# Patient Record
Sex: Male | Born: 1985 | Race: White | Hispanic: No | Marital: Married | Smoking: Current every day smoker
Health system: Southern US, Community
[De-identification: ages and names within clinical notes are randomized; demographics above are authoritative.]

## PROBLEM LIST (undated history)

## (undated) DIAGNOSIS — N19 Unspecified kidney failure: Secondary | ICD-10-CM

## (undated) DIAGNOSIS — K219 Gastro-esophageal reflux disease without esophagitis: Secondary | ICD-10-CM

## (undated) HISTORY — PX: DENTAL SURGERY: SHX609

---

## 2006-08-04 ENCOUNTER — Emergency Department (HOSPITAL_COMMUNITY): Admission: EM | Admit: 2006-08-04 | Discharge: 2006-08-04 | Payer: Self-pay | Admitting: Emergency Medicine

## 2006-08-21 ENCOUNTER — Emergency Department (HOSPITAL_COMMUNITY): Admission: EM | Admit: 2006-08-21 | Discharge: 2006-08-21 | Payer: Self-pay | Admitting: Emergency Medicine

## 2007-08-05 ENCOUNTER — Emergency Department (HOSPITAL_COMMUNITY): Admission: EM | Admit: 2007-08-05 | Discharge: 2007-08-05 | Payer: Self-pay | Admitting: Emergency Medicine

## 2012-05-04 ENCOUNTER — Encounter (HOSPITAL_COMMUNITY): Payer: Self-pay | Admitting: Physical Medicine and Rehabilitation

## 2012-05-04 ENCOUNTER — Emergency Department (HOSPITAL_COMMUNITY)
Admission: EM | Admit: 2012-05-04 | Discharge: 2012-05-04 | Disposition: A | Discharge status: Home / Self Care | Payer: Self-pay | Attending: Emergency Medicine | Admitting: Emergency Medicine

## 2012-05-04 DIAGNOSIS — F17200 Nicotine dependence, unspecified, uncomplicated: Secondary | ICD-10-CM | POA: Insufficient documentation

## 2012-05-04 DIAGNOSIS — M545 Low back pain, unspecified: Secondary | ICD-10-CM | POA: Insufficient documentation

## 2012-05-04 HISTORY — DX: Unspecified kidney failure: N19

## 2012-05-04 LAB — CBC WITH DIFFERENTIAL/PLATELET
Basophils Relative: 0 % (ref 0–1)
Eosinophils Absolute: 0.2 10*3/uL (ref 0.0–0.7)
Eosinophils Relative: 2 % (ref 0–5)
Hemoglobin: 16.8 g/dL (ref 13.0–17.0)
MCH: 34.4 pg — ABNORMAL HIGH (ref 26.0–34.0)
Monocytes Absolute: 0.6 10*3/uL (ref 0.1–1.0)
Neutro Abs: 5.3 10*3/uL (ref 1.7–7.7)
Neutrophils Relative %: 56 % (ref 43–77)
Platelets: 329 10*3/uL (ref 150–400)
RBC: 4.89 MIL/uL (ref 4.22–5.81)

## 2012-05-04 LAB — COMPREHENSIVE METABOLIC PANEL
ALT: 13 U/L (ref 0–53)
CO2: 27 mEq/L (ref 19–32)
Calcium: 9.8 mg/dL (ref 8.4–10.5)
Chloride: 101 mEq/L (ref 96–112)
Creatinine, Ser: 1.22 mg/dL (ref 0.50–1.35)
GFR calc Af Amer: >90 mL/min (ref >90)
GFR calc non Af Amer: 80 mL/min — ABNORMAL LOW (ref >90)
Glucose, Bld: 103 mg/dL — ABNORMAL HIGH (ref 70–99)
Sodium: 139 mEq/L (ref 135–145)
Total Bilirubin: 0.5 mg/dL (ref 0.3–1.2)

## 2012-05-04 LAB — URINALYSIS, ROUTINE W REFLEX MICROSCOPIC
Bilirubin Urine: NEGATIVE
Glucose, UA: NEGATIVE mg/dL
Hgb urine dipstick: NEGATIVE
Protein, ur: NEGATIVE mg/dL
Urobilinogen, UA: 0.2 mg/dL (ref 0.0–1.0)

## 2012-05-04 MED ORDER — OXYCODONE-ACETAMINOPHEN 5-325 MG PO TABS
1.0000 | ORAL_TABLET | Freq: Once | ORAL | Status: AC
  Administered 2012-05-04: 1 via ORAL
  Filled 2012-05-04: qty 1

## 2012-05-04 MED ORDER — OXYCODONE-ACETAMINOPHEN 5-325 MG PO TABS: 2.0000 | ORAL_TABLET | Freq: Four times a day (QID) | ORAL | Status: DC | PRN

## 2012-05-04 MED ORDER — CYCLOBENZAPRINE HCL 10 MG PO TABS: 10.0000 mg | ORAL_TABLET | Freq: Three times a day (TID) | ORAL | Status: DC | PRN

## 2012-05-04 NOTE — ED Notes (Signed)
Pt presents to department for evaluation of lower back pain. Onset last night. Pt states he was diagnosed with kidney failure in June, concerned that labs are elevated again. Denies urinary symptoms. 10/10 pain. He is conscious alert and oriented x4.

## 2012-05-04 NOTE — ED Provider Notes (Signed)
History   This chart was scribed for Hurman Horn, MD by Melba Coon. The patient was seen in room TR10C/TR10C and the patient's care was started at 6:50PM.    CSN: [id number]  Arrival date & time 05/04/12  1316   First MD Initiated Contact with Patient 05/04/12 1848      Chief Complaint  Patient presents with  . Back Pain    (Consider location/radiation/quality/duration/timing/severity/associated sxs/prior treatment) HPI Eddie Allen is a 26 y.o. male who presents to the Emergency Department complaining of constant, moderate to severe lower back pain with a sudden onset last night. Eddie Allen states that he coughed while driving then the pain started. He is concerned that he has abnormal kidney labs. He rates the severity of the ain 10/10. No radiation down to the lower extremities. Moving and changing positions aggravates the pain.  No HA, fever, neck pain, sore throat, rash, CP, SOB, abd pain, n/v/d, dysuria, bowl or bladder dysfunction, or extremity pain, edema, weakness, numbness, or tingling. Hx of renal failure. No Hx of chronic back pain. No known allergies. No other pertinent medical symptoms.  Past Medical History  Diagnosis Date  . Renal failure     No past surgical history on file.  History reviewed. No pertinent family history.  History  Substance Use Topics  . Smoking status: Current Every Day Smoker    Types: Cigarettes  . Smokeless tobacco: Not on file  . Alcohol Use: No      Review of Systems 10 Systems reviewed and all are negative for acute change except as noted in the HPI.   Allergies  Review of patient's allergies indicates no known allergies.  Home Medications   Current Outpatient Rx  Name Route Sig Dispense Refill  . AZO TABS PO Oral Take 2 capsules by mouth once. As needed uti pain.    . CYCLOBENZAPRINE HCL 10 MG PO TABS Oral Take 1 tablet (10 mg total) by mouth 3 (three) times daily as needed for muscle spasms. 20 tablet 0  .  OXYCODONE-ACETAMINOPHEN 5-325 MG PO TABS Oral Take 2 tablets by mouth every 6 (six) hours as needed for pain. 20 tablet 0    BP 129/74  Pulse 76  Temp 98.6 F (37 C) (Oral)  Resp 16  SpO2 97%  Physical Exam  Nursing note and vitals reviewed. Constitutional:       Awake, alert, nontoxic appearance with baseline speech.  HENT:  Head: Atraumatic.  Eyes: Pupils are equal, round, and reactive to light. Right eye exhibits no discharge. Left eye exhibits no discharge.  Neck: Neck supple.  Cardiovascular: Normal rate and regular rhythm.   No murmur heard. Pulmonary/Chest: Effort normal and breath sounds normal. No respiratory distress. He has no wheezes. He has no rales. He exhibits no tenderness.  Abdominal: Soft. Bowel sounds are normal. He exhibits no mass. There is no tenderness. There is no rebound.  Musculoskeletal: He exhibits tenderness (Diffuse lumbar and paralumbar tenderness with no rash).       Thoracic back: He exhibits no tenderness.       Lumbar back: He exhibits no tenderness.       Bilateral lower extremities non tender without new rashes or color change, baseline ROM with intact DP pulses, CR<2 secs all digits bilaterally, sensation baseline light touch bilaterally for pt, DTR's symmetric and intact bilaterally KJ / AJ, motor symmetric bilateral 5 / 5 hip flexion, quadriceps, hamstrings, EHL, foot dorsiflexion, foot plantarflexion, gait somewhat antalgic but without apparent  new ataxia.  Neurological:       Mental status baseline for patient.  Upper extremity motor strength and sensation intact and symmetric bilaterally.  Skin: No rash noted.  Psychiatric: He has a normal mood and affect.    ED Course  Procedures (including critical care time)  COORDINATION OF CARE:  6:55PM - Labs reviewed and are unremarkable; no kidney failure based on labs. Percocet and flexeril per pt's request will be given to Eddie Allen here at the ED, and he is ready for d/c. He is advised to  f/u with PCP or ED and get an MRI.   Labs Reviewed  URINALYSIS, ROUTINE W REFLEX MICROSCOPIC - Abnormal; Notable for the following:    Color, Urine AMBER (*)  BIOCHEMICALS MAY BE AFFECTED BY COLOR   All other components within normal limits  CBC WITH DIFFERENTIAL - Abnormal; Notable for the following:    MCH 34.4 (*)     MCHC 37.0 (*)     All other components within normal limits  COMPREHENSIVE METABOLIC PANEL - Abnormal; Notable for the following:    Glucose, Bld 103 (*)     GFR calc non Af Amer 80 (*)     All other components within normal limits  LAB REPORT - SCANNED   No results found.   1. Low back pain       MDM   Pt stable in ED with no significant deterioration in condition.Patient / Family / Caregiver informed of clinical course, understand medical decision-making process, and agree with plan.I doubt any other EMC precluding discharge at this time including, but not necessarily limited to the following:[facility]. I personally performed the services described in this documentation, which was scribed in my presence. The recorded information has been reviewed and considered.      Hurman Horn, MD 05/05/12 2025

## 2016-12-28 ENCOUNTER — Other Ambulatory Visit: Payer: Self-pay | Admitting: Orthopedic Surgery

## 2016-12-28 DIAGNOSIS — M25572 Pain in left ankle and joints of left foot: Secondary | ICD-10-CM

## 2017-01-05 ENCOUNTER — Ambulatory Visit
Admission: RE | Admit: 2017-01-05 | Discharge: 2017-01-05 | Disposition: A | Discharge status: Home / Self Care | Payer: Medicaid Other | Source: Ambulatory Visit | Attending: Orthopedic Surgery | Admitting: Orthopedic Surgery

## 2017-01-05 DIAGNOSIS — M25572 Pain in left ankle and joints of left foot: Secondary | ICD-10-CM

## 2017-05-03 ENCOUNTER — Other Ambulatory Visit: Payer: Self-pay | Admitting: Orthopedic Surgery

## 2017-05-03 DIAGNOSIS — M5030 Other cervical disc degeneration, unspecified cervical region: Secondary | ICD-10-CM

## 2017-05-03 DIAGNOSIS — M5410 Radiculopathy, site unspecified: Secondary | ICD-10-CM

## 2017-05-17 ENCOUNTER — Ambulatory Visit
Admission: RE | Admit: 2017-05-17 | Discharge: 2017-05-17 | Disposition: A | Discharge status: Home / Self Care | Payer: Medicaid Other | Source: Ambulatory Visit | Attending: Orthopedic Surgery | Admitting: Orthopedic Surgery

## 2017-05-17 DIAGNOSIS — M5030 Other cervical disc degeneration, unspecified cervical region: Secondary | ICD-10-CM

## 2017-05-17 DIAGNOSIS — M5410 Radiculopathy, site unspecified: Secondary | ICD-10-CM

## 2018-04-20 IMAGING — MR MR CERVICAL SPINE W/O CM
5 series · 28 of 48 positions shown · non-contrast
Comparison: Lateral cervical spine radiographs 10/22/2014. Cervical
spine CT 03/21/2013.

CLINICAL DATA: Worsening neck pain for 2 years. Weakness and
numbness in the hands.

EXAM:
MRI CERVICAL SPINE WITHOUT CONTRAST
TECHNIQUE: Multiplanar, multisequence MR imaging of the cervical spine was
performed. No intravenous contrast was administered.

[Series 6: T1 · sagittal · 3.0mm · 0.72mm/px · 6 of 15 slices shown]
[im 1/15]
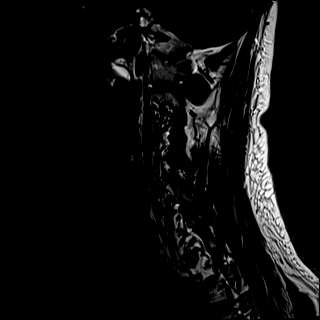
[im 3/15]
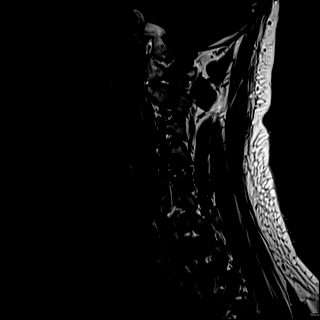
[im 6/15]
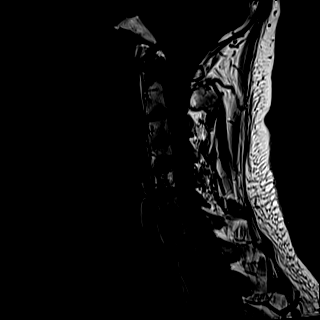
[im 9/15]
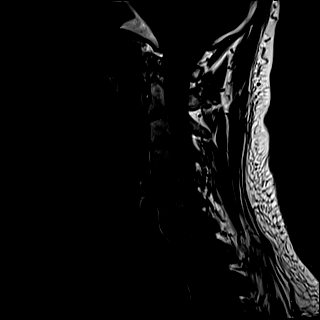
[im 12/15]
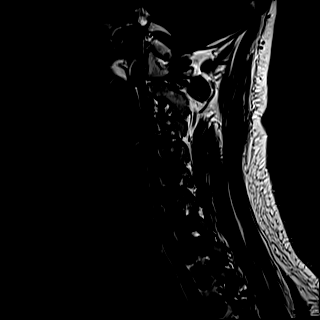
[im 15/15]
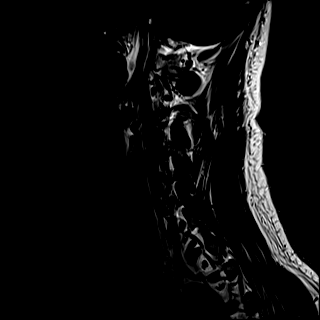

[Series 7: T2 · sagittal · 3.0mm · 0.60mm/px · 6 of 15 slices shown (1 of 2)]
[im 1/15]
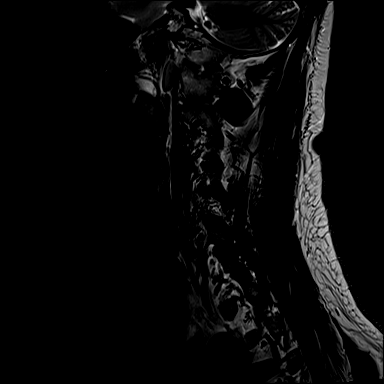
[im 3/15]
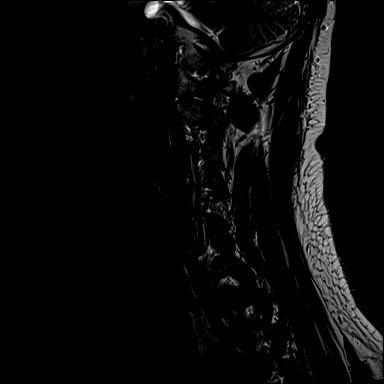
[im 6/15]
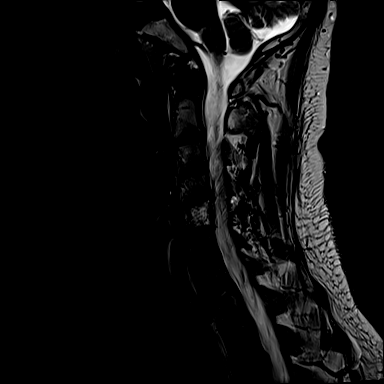
[im 9/15]
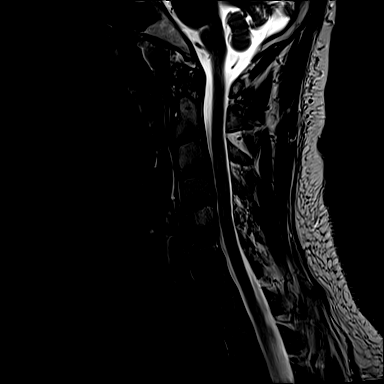
[im 12/15]
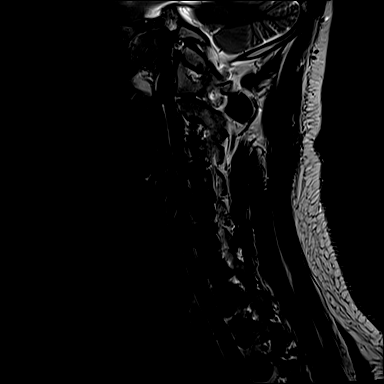
[im 15/15]
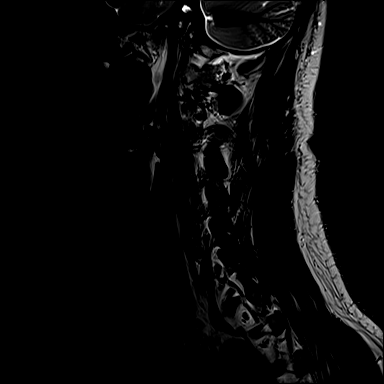

[Series 8: STIR · sagittal · 3.0mm · 0.36mm/px · 6 of 15 slices shown]
[im 1/15]
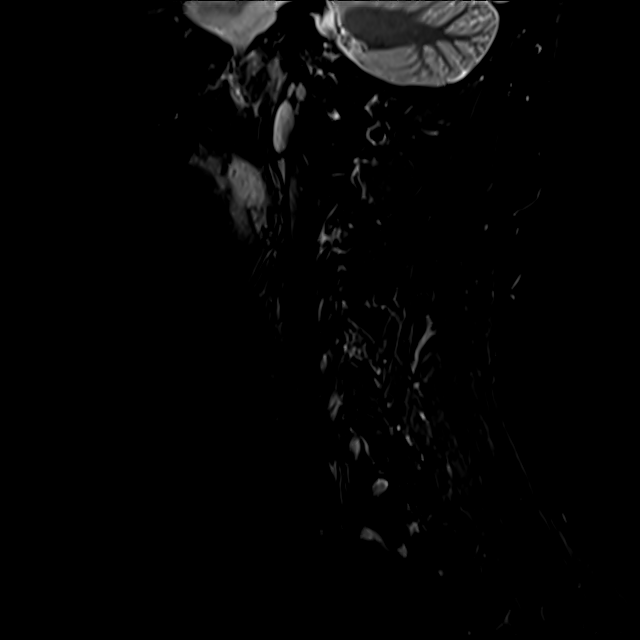
[im 3/15]
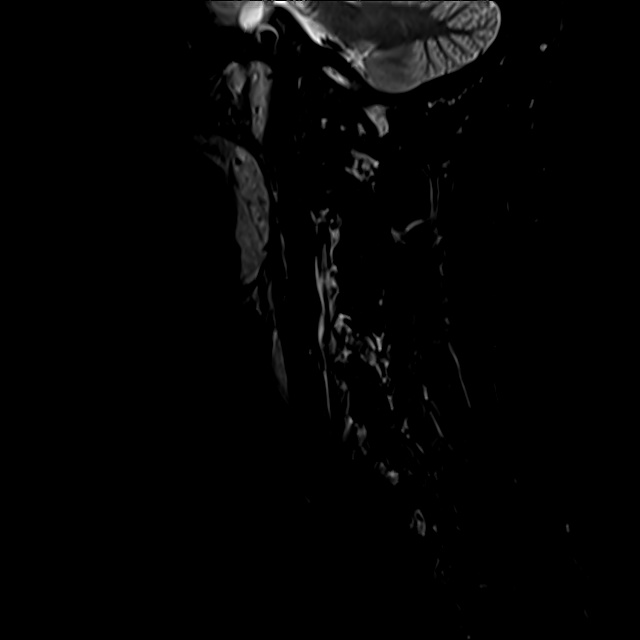
[im 6/15]
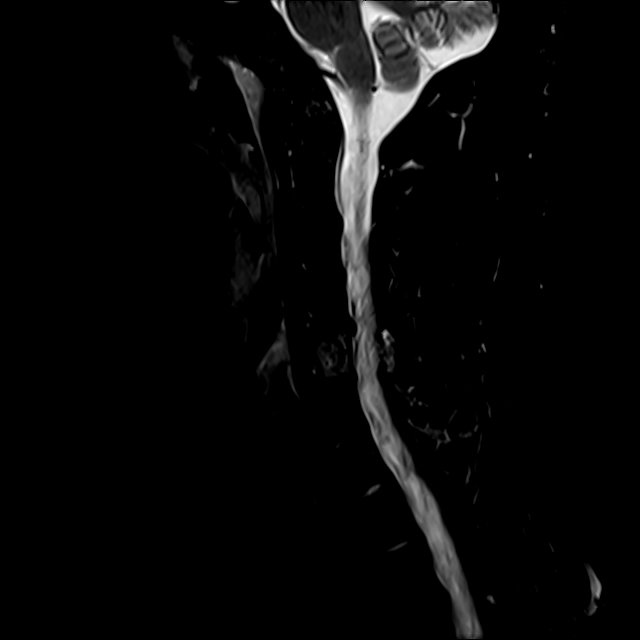
[im 9/15]
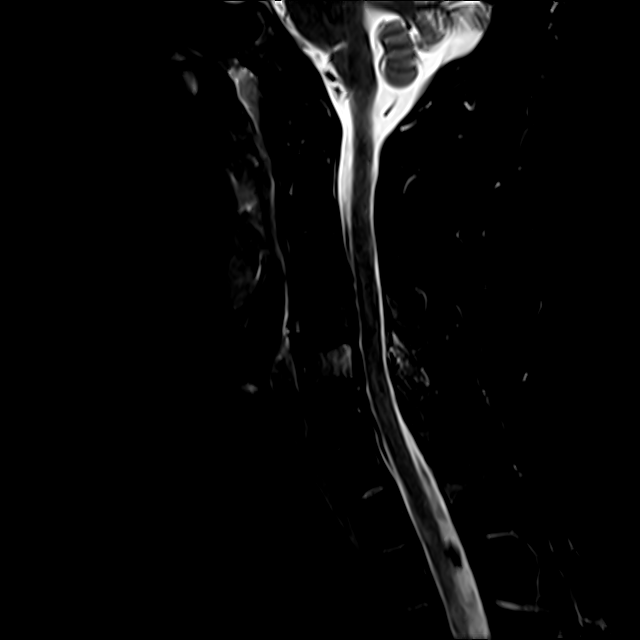
[im 12/15]
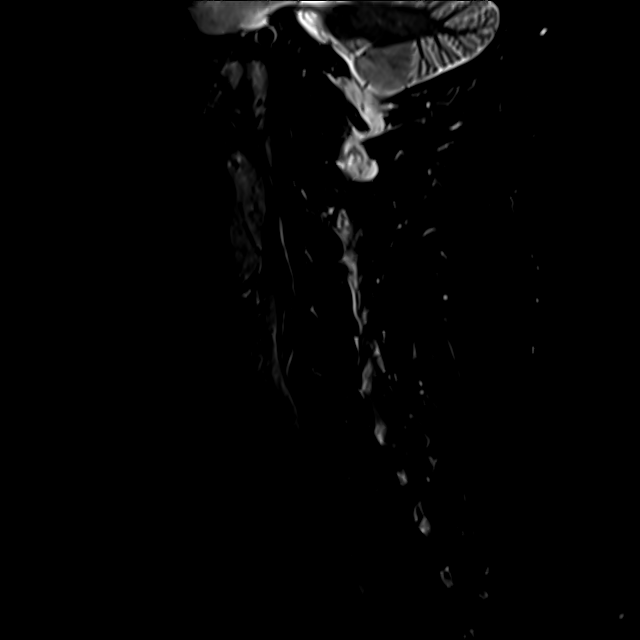
[im 15/15]
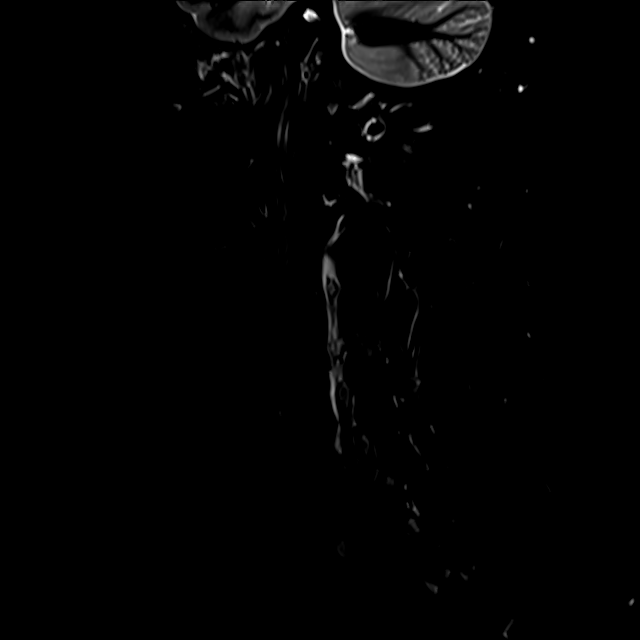

[Series 9: T2 · axial · 3.0mm · 0.50mm/px · z∈[-27,+94]mm · 9 of 39 slices shown (2 of 2)]
[im 1/39]
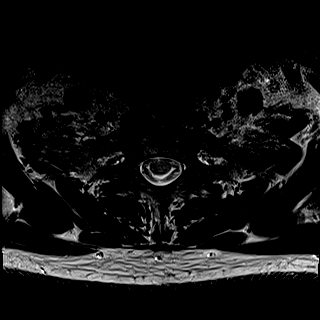
[im 6/39]
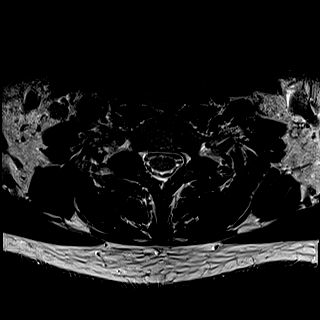
[im 11/39]
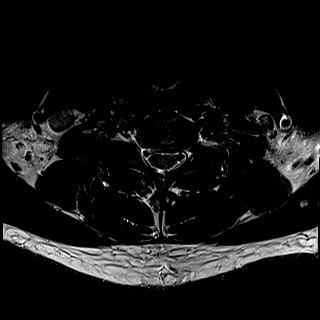
[im 17/39]
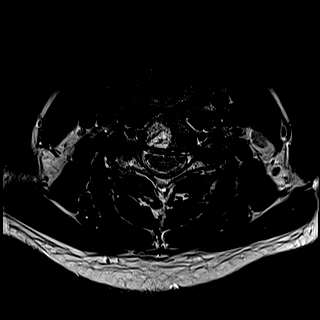
[im 20/39]
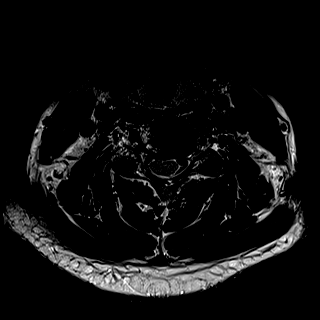
[im 22/39]
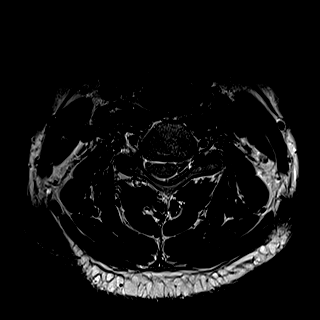
[im 28/39]
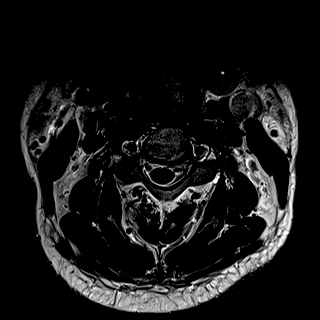
[im 33/39]
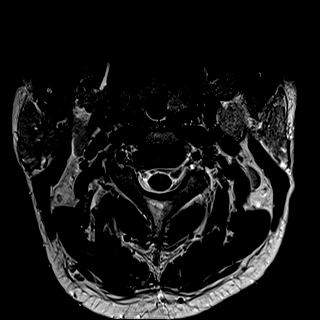
[im 39/39]
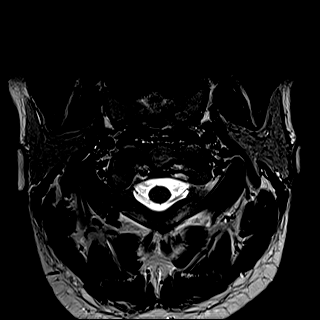

[Series 10: GRE · axial · 3.0mm · 0.42mm/px · 1 of 39 slices shown]
[im 1/39]
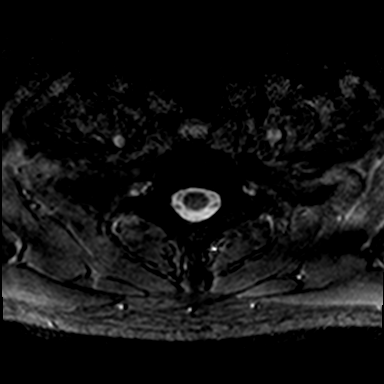

[28 of 48 positions shown; findings below may reference images not displayed]

FINDINGS: Alignment: Straightening of the normal cervical lordosis. No
significant listhesis.

Vertebrae: No evidence of fracture. Large hemangioma in the right
aspect of the C5 vertebral body with involvement of the right
greater than left posterior elements as seen on prior CT.

Cord: Normal signal.

Posterior Fossa, vertebral arteries, paraspinal tissues:
Unremarkable.

Disc levels:

C2-3: Right uncovertebral spurring results in mild right neural
foraminal stenosis. No spinal stenosis.

C3-4:  Minimal uncovertebral spurring without significant stenosis.

C4-5: Right paracentral disc protrusion and uncovertebral spurring
result in mild spinal stenosis with slight ventral cord flattening
and mild-to-moderate bilateral neural foraminal stenosis potentially
affecting the C5 nerve roots.

C5-6: Shallow central disc protrusion with annular fissure without
spinal stenosis or spinal cord mass effect. Mild uncovertebral
spurring results in borderline to mild bilateral neural foraminal
stenosis.

C6-7: Minimal disc bulging and uncovertebral spurring without
stenosis.

C7-T1:  Negative.
IMPRESSION: Mild cervical spondylosis, greatest at C4-5 where there is mild
spinal stenosis and mild-to-moderate neural foraminal stenosis.

## 2022-09-27 ENCOUNTER — Other Ambulatory Visit: Payer: Self-pay | Admitting: *Deleted

## 2022-09-27 DIAGNOSIS — K8020 Calculus of gallbladder without cholecystitis without obstruction: Secondary | ICD-10-CM

## 2022-09-28 ENCOUNTER — Encounter (HOSPITAL_COMMUNITY)
Admission: RE | Admit: 2022-09-28 | Discharge: 2022-09-28 | Disposition: A | Discharge status: Home / Self Care | Payer: Medicaid Other | Source: Ambulatory Visit | Attending: General Surgery | Admitting: General Surgery

## 2022-09-28 ENCOUNTER — Encounter: Payer: Self-pay | Admitting: General Surgery

## 2022-09-28 ENCOUNTER — Other Ambulatory Visit: Payer: Self-pay

## 2022-09-28 ENCOUNTER — Ambulatory Visit (INDEPENDENT_AMBULATORY_CARE_PROVIDER_SITE_OTHER): Payer: Medicaid Other | Admitting: General Surgery

## 2022-09-28 ENCOUNTER — Encounter (HOSPITAL_COMMUNITY): Payer: Self-pay

## 2022-09-28 VITALS — BP 120/73 | HR 107 | Temp 98.2°F | Resp 16 | Ht 75.0 in | Wt 237.0 lb

## 2022-09-28 DIAGNOSIS — K8001 Calculus of gallbladder with acute cholecystitis with obstruction: Secondary | ICD-10-CM

## 2022-09-28 HISTORY — DX: Gastro-esophageal reflux disease without esophagitis: K21.9

## 2022-09-28 NOTE — H&P (Signed)
Eddie Allen; [id number]; 06-11-1986   HPI Patient is a 37 year old white male who was referred to my care by Dr. Sherrie Sport for evaluation and treatment of cholelithiasis.  Patient has had intermittent right upper quadrant abdominal pain with radiation to his back and nausea for the last few months.  Recently he was seen in the emergency room and was found to have cholelithiasis.  As his symptoms were increasing in frequency and severity, a recent ultrasound was done which reveals a 1.7 cm gallstone in the neck of the gallbladder.  His liver enzyme tests were normal just prior to this ultrasound.  He has tried to limit his diet in order to not trigger an attack.  He denies any fever or chills.  He denies any jaundice.  The attacks are occurring every other day. Past Medical History:  Diagnosis Date   Renal failure     History reviewed. No pertinent surgical history.  History reviewed. No pertinent family history.  Current Outpatient Medications on File Prior to Visit  Medication Sig Dispense Refill   diazepam (VALIUM) 5 MG tablet Take 7.5 mg by mouth 2 (two) times daily.     SUBOXONE 8-2 MG FILM Place under the tongue 2 (two) times daily.     No current facility-administered medications on file prior to visit.    No Known Allergies  Social History   Substance and Sexual Activity  Alcohol Use No    Social History   Tobacco Use  Smoking Status Every Day   Types: Cigarettes  Smokeless Tobacco Not on file    Review of Systems  Constitutional: Negative.   HENT: Negative.    Eyes: Negative.   Respiratory: Negative.    Cardiovascular: Negative.   Gastrointestinal:  Positive for abdominal pain, heartburn and nausea.  Genitourinary: Negative.   Musculoskeletal:  Positive for back pain, joint pain and neck pain.  Skin: Negative.   Neurological: Negative.   Endo/Heme/Allergies: Negative.   Psychiatric/Behavioral:  The patient is nervous/anxious.     Objective   Vitals:    09/28/22 0921  BP: 120/73  Pulse: (!) 107  Resp: 16  Temp: 98.2 F (36.8 C)  SpO2: 98%    Physical Exam Vitals reviewed.  Constitutional:      Appearance: Normal appearance. He is normal weight. He is not ill-appearing.  HENT:     Head: Normocephalic and atraumatic.  Eyes:     General: No scleral icterus. Cardiovascular:     Rate and Rhythm: Normal rate and regular rhythm.     Heart sounds: Normal heart sounds. No murmur heard.    No friction rub. No gallop.  Pulmonary:     Effort: Pulmonary effort is normal. No respiratory distress.     Breath sounds: Normal breath sounds. No stridor. No wheezing, rhonchi or rales.  Abdominal:     General: Bowel sounds are normal. There is no distension.     Palpations: Abdomen is soft. There is no mass.     Tenderness: There is abdominal tenderness. There is no guarding or rebound.     Hernia: No hernia is present.     Comments: Tender in the right upper quadrant to palpation.  No rigidity noted.  Skin:    General: Skin is warm and dry.  Neurological:     Mental Status: He is alert and oriented to person, place, and time.    ER notes reviewed, ultrasound report reviewed Assessment  Cholecystitis secondary to cholelithiasis Plan  Patient is scheduled for robotic  assisted laparoscopic cholecystectomy on 09/29/2022.  The risks and benefits of the procedure including bleeding, infection, hepatobiliary injury, and the possibility of an open procedure were fully explained to the patient, who gave informed consent.

## 2022-09-28 NOTE — Progress Notes (Signed)
Eddie Allen; [id number]; 1986/04/30   HPI Patient is a 37 year old white male who was referred to my care by Dr. Sherrie Sport for evaluation and treatment of cholelithiasis.  Patient has had intermittent right upper quadrant abdominal pain with radiation to his back and nausea for the last few months.  Recently he was seen in the emergency room and was found to have cholelithiasis.  As his symptoms were increasing in frequency and severity, a recent ultrasound was done which reveals a 1.7 cm gallstone in the neck of the gallbladder.  His liver enzyme tests were normal just prior to this ultrasound.  He has tried to limit his diet in order to not trigger an attack.  He denies any fever or chills.  He denies any jaundice.  The attacks are occurring every other day. Past Medical History:  Diagnosis Date   Renal failure     History reviewed. No pertinent surgical history.  History reviewed. No pertinent family history.  Current Outpatient Medications on File Prior to Visit  Medication Sig Dispense Refill   diazepam (VALIUM) 5 MG tablet Take 7.5 mg by mouth 2 (two) times daily.     SUBOXONE 8-2 MG FILM Place under the tongue 2 (two) times daily.     No current facility-administered medications on file prior to visit.    No Known Allergies  Social History   Substance and Sexual Activity  Alcohol Use No    Social History   Tobacco Use  Smoking Status Every Day   Types: Cigarettes  Smokeless Tobacco Not on file    Review of Systems  Constitutional: Negative.   HENT: Negative.    Eyes: Negative.   Respiratory: Negative.    Cardiovascular: Negative.   Gastrointestinal:  Positive for abdominal pain, heartburn and nausea.  Genitourinary: Negative.   Musculoskeletal:  Positive for back pain, joint pain and neck pain.  Skin: Negative.   Neurological: Negative.   Endo/Heme/Allergies: Negative.   Psychiatric/Behavioral:  The patient is nervous/anxious.     Objective   Vitals:    09/28/22 0921  BP: 120/73  Pulse: (!) 107  Resp: 16  Temp: 98.2 F (36.8 C)  SpO2: 98%    Physical Exam Vitals reviewed.  Constitutional:      Appearance: Normal appearance. He is normal weight. He is not ill-appearing.  HENT:     Head: Normocephalic and atraumatic.  Eyes:     General: No scleral icterus. Cardiovascular:     Rate and Rhythm: Normal rate and regular rhythm.     Heart sounds: Normal heart sounds. No murmur heard.    No friction rub. No gallop.  Pulmonary:     Effort: Pulmonary effort is normal. No respiratory distress.     Breath sounds: Normal breath sounds. No stridor. No wheezing, rhonchi or rales.  Abdominal:     General: Bowel sounds are normal. There is no distension.     Palpations: Abdomen is soft. There is no mass.     Tenderness: There is abdominal tenderness. There is no guarding or rebound.     Hernia: No hernia is present.     Comments: Tender in the right upper quadrant to palpation.  No rigidity noted.  Skin:    General: Skin is warm and dry.  Neurological:     Mental Status: He is alert and oriented to person, place, and time.    ER notes reviewed, ultrasound report reviewed Assessment  Cholecystitis secondary to cholelithiasis Plan  Patient is scheduled for robotic  assisted laparoscopic cholecystectomy on 09/29/2022.  The risks and benefits of the procedure including bleeding, infection, hepatobiliary injury, and the possibility of an open procedure were fully explained to the patient, who gave informed consent.

## 2022-09-28 NOTE — Addendum Note (Signed)
Addended by: Aviva Signs A on: 09/28/2022 10:25 AM   Modules accepted: Level of Service

## 2022-09-29 ENCOUNTER — Ambulatory Visit (HOSPITAL_COMMUNITY): Payer: Medicaid Other | Admitting: Anesthesiology

## 2022-09-29 ENCOUNTER — Encounter (HOSPITAL_COMMUNITY): Payer: Self-pay | Admitting: General Surgery

## 2022-09-29 ENCOUNTER — Ambulatory Visit (HOSPITAL_BASED_OUTPATIENT_CLINIC_OR_DEPARTMENT_OTHER): Payer: Medicaid Other | Admitting: Anesthesiology

## 2022-09-29 ENCOUNTER — Ambulatory Visit (HOSPITAL_COMMUNITY)
Admission: RE | Admit: 2022-09-29 | Discharge: 2022-09-29 | Disposition: A | Discharge status: Home / Self Care | Payer: Medicaid Other | Attending: General Surgery | Admitting: General Surgery

## 2022-09-29 ENCOUNTER — Encounter (HOSPITAL_COMMUNITY)
Admission: RE | Disposition: A | Discharge status: Home / Self Care | Payer: Self-pay | Source: Home / Self Care | Attending: General Surgery

## 2022-09-29 DIAGNOSIS — K8070 Calculus of gallbladder and bile duct without cholecystitis without obstruction: Secondary | ICD-10-CM | POA: Diagnosis not present

## 2022-09-29 DIAGNOSIS — K8010 Calculus of gallbladder with chronic cholecystitis without obstruction: Secondary | ICD-10-CM | POA: Insufficient documentation

## 2022-09-29 DIAGNOSIS — F17210 Nicotine dependence, cigarettes, uncomplicated: Secondary | ICD-10-CM | POA: Diagnosis not present

## 2022-09-29 DIAGNOSIS — K8020 Calculus of gallbladder without cholecystitis without obstruction: Secondary | ICD-10-CM | POA: Diagnosis not present

## 2022-09-29 DIAGNOSIS — K219 Gastro-esophageal reflux disease without esophagitis: Secondary | ICD-10-CM | POA: Insufficient documentation

## 2022-09-29 SURGERY — CHOLECYSTECTOMY, ROBOT-ASSISTED, LAPAROSCOPIC
Anesthesia: General | Site: Abdomen

## 2022-09-29 MED ORDER — FENTANYL CITRATE (PF) 100 MCG/2ML IJ SOLN
INTRAMUSCULAR | Status: DC | PRN
  Administered 2022-09-29: 100 ug via INTRAVENOUS
  Administered 2022-09-29 (×2): 50 ug via INTRAVENOUS
  Administered 2022-09-29: 100 ug via INTRAVENOUS

## 2022-09-29 MED ORDER — SUGAMMADEX SODIUM 500 MG/5ML IV SOLN
INTRAVENOUS | Status: DC | PRN
  Administered 2022-09-29: 400 mg via INTRAVENOUS

## 2022-09-29 MED ORDER — KETOROLAC TROMETHAMINE 30 MG/ML IJ SOLN
INTRAMUSCULAR | Status: DC | PRN
  Administered 2022-09-29: 30 mg via INTRAVENOUS

## 2022-09-29 MED ORDER — PROPOFOL 10 MG/ML IV BOLUS
INTRAVENOUS | Status: AC
  Filled 2022-09-29: qty 20

## 2022-09-29 MED ORDER — KETAMINE HCL 10 MG/ML IJ SOLN
INTRAMUSCULAR | Status: DC | PRN
  Administered 2022-09-29: 50 mg via INTRAVENOUS

## 2022-09-29 MED ORDER — DEXMEDETOMIDINE HCL IN NACL 80 MCG/20ML IV SOLN
INTRAVENOUS | Status: DC | PRN
  Administered 2022-09-29: 20 ug via BUCCAL

## 2022-09-29 MED ORDER — FENTANYL CITRATE PF 50 MCG/ML IJ SOSY
25.0000 ug | PREFILLED_SYRINGE | INTRAMUSCULAR | Status: DC | PRN
  Administered 2022-09-29 (×3): 50 ug via INTRAVENOUS
  Filled 2022-09-29 (×3): qty 1

## 2022-09-29 MED ORDER — ORAL CARE MOUTH RINSE: 15.0000 mL | Freq: Once | OROMUCOSAL | Status: AC

## 2022-09-29 MED ORDER — MEPERIDINE HCL 50 MG/ML IJ SOLN: 6.2500 mg | INTRAMUSCULAR | Status: DC | PRN

## 2022-09-29 MED ORDER — LACTATED RINGERS IV SOLN: INTRAVENOUS | Status: DC

## 2022-09-29 MED ORDER — INDOCYANINE GREEN 25 MG IV SOLR
2.5000 mg | Freq: Once | INTRAVENOUS | Status: AC
  Administered 2022-09-29: 2.5 mg via INTRAVENOUS

## 2022-09-29 MED ORDER — MIDAZOLAM HCL 2 MG/2ML IJ SOLN
INTRAMUSCULAR | Status: AC
  Filled 2022-09-29: qty 2

## 2022-09-29 MED ORDER — BUPIVACAINE LIPOSOME 1.3 % IJ SUSP
INTRAMUSCULAR | Status: DC | PRN
  Administered 2022-09-29: 20 mL

## 2022-09-29 MED ORDER — OXYCODONE HCL 5 MG PO TABS
10.0000 mg | ORAL_TABLET | Freq: Once | ORAL | Status: AC
  Administered 2022-09-29: 10 mg via ORAL

## 2022-09-29 MED ORDER — KETOROLAC TROMETHAMINE 30 MG/ML IJ SOLN
INTRAMUSCULAR | Status: AC
  Filled 2022-09-29: qty 1

## 2022-09-29 MED ORDER — BUPIVACAINE LIPOSOME 1.3 % IJ SUSP
INTRAMUSCULAR | Status: AC
  Filled 2022-09-29: qty 20

## 2022-09-29 MED ORDER — OXYCODONE HCL 5 MG PO TABS
ORAL_TABLET | ORAL | Status: AC
  Filled 2022-09-29: qty 2

## 2022-09-29 MED ORDER — INDOCYANINE GREEN 25 MG IV SOLR
INTRAVENOUS | Status: AC
  Filled 2022-09-29: qty 10

## 2022-09-29 MED ORDER — DEXMEDETOMIDINE HCL IN NACL 80 MCG/20ML IV SOLN
INTRAVENOUS | Status: AC
  Filled 2022-09-29: qty 40

## 2022-09-29 MED ORDER — CHLORHEXIDINE GLUCONATE CLOTH 2 % EX PADS: 6.0000 | MEDICATED_PAD | Freq: Once | CUTANEOUS | Status: DC

## 2022-09-29 MED ORDER — ONDANSETRON HCL 4 MG/2ML IJ SOLN: 4.0000 mg | Freq: Once | INTRAMUSCULAR | Status: DC | PRN

## 2022-09-29 MED ORDER — DEXAMETHASONE SODIUM PHOSPHATE 10 MG/ML IJ SOLN
INTRAMUSCULAR | Status: DC | PRN
  Administered 2022-09-29: 10 mg via INTRAVENOUS

## 2022-09-29 MED ORDER — KETAMINE HCL 50 MG/5ML IJ SOSY
PREFILLED_SYRINGE | INTRAMUSCULAR | Status: AC
  Filled 2022-09-29: qty 5

## 2022-09-29 MED ORDER — STERILE WATER FOR IRRIGATION IR SOLN
Status: DC | PRN
  Administered 2022-09-29: 500 mL

## 2022-09-29 MED ORDER — ONDANSETRON HCL 4 MG/2ML IJ SOLN
INTRAMUSCULAR | Status: DC | PRN
  Administered 2022-09-29: 4 mg via INTRAVENOUS

## 2022-09-29 MED ORDER — ROCURONIUM BROMIDE 10 MG/ML (PF) SYRINGE
PREFILLED_SYRINGE | INTRAVENOUS | Status: AC
  Filled 2022-09-29: qty 40

## 2022-09-29 MED ORDER — ESMOLOL HCL 100 MG/10ML IV SOLN
INTRAVENOUS | Status: AC
  Filled 2022-09-29: qty 10

## 2022-09-29 MED ORDER — FENTANYL CITRATE (PF) 100 MCG/2ML IJ SOLN
INTRAMUSCULAR | Status: AC
  Filled 2022-09-29: qty 2

## 2022-09-29 MED ORDER — OXYCODONE HCL 5 MG PO TABS: 5.0000 mg | ORAL_TABLET | ORAL | 0 refills | Status: AC | PRN

## 2022-09-29 MED ORDER — PROPOFOL 10 MG/ML IV BOLUS
INTRAVENOUS | Status: DC | PRN
  Administered 2022-09-29: 200 mg via INTRAVENOUS

## 2022-09-29 MED ORDER — CHLORHEXIDINE GLUCONATE 0.12 % MT SOLN
15.0000 mL | Freq: Once | OROMUCOSAL | Status: AC
  Administered 2022-09-29: 15 mL via OROMUCOSAL

## 2022-09-29 MED ORDER — ONDANSETRON HCL 4 MG/2ML IJ SOLN
INTRAMUSCULAR | Status: AC
  Filled 2022-09-29: qty 4

## 2022-09-29 MED ORDER — SEVOFLURANE IN SOLN
RESPIRATORY_TRACT | Status: AC
  Filled 2022-09-29: qty 250

## 2022-09-29 MED ORDER — MIDAZOLAM HCL 5 MG/5ML IJ SOLN
INTRAMUSCULAR | Status: DC | PRN
  Administered 2022-09-29 (×2): 2 mg via INTRAVENOUS

## 2022-09-29 MED ORDER — CEFAZOLIN SODIUM-DEXTROSE 2-4 GM/100ML-% IV SOLN
INTRAVENOUS | Status: AC
  Filled 2022-09-29: qty 100

## 2022-09-29 MED ORDER — CEFAZOLIN SODIUM-DEXTROSE 2-4 GM/100ML-% IV SOLN
2.0000 g | INTRAVENOUS | Status: AC
  Administered 2022-09-29: 2 g via INTRAVENOUS

## 2022-09-29 MED ORDER — ROCURONIUM BROMIDE 100 MG/10ML IV SOLN
INTRAVENOUS | Status: DC | PRN
  Administered 2022-09-29: 70 mg via INTRAVENOUS

## 2022-09-29 MED ORDER — DEXAMETHASONE SODIUM PHOSPHATE 10 MG/ML IJ SOLN
INTRAMUSCULAR | Status: AC
  Filled 2022-09-29: qty 2

## 2022-09-29 SURGICAL SUPPLY — 44 items
ADH SKN CLS APL DERMABOND .7 (GAUZE/BANDAGES/DRESSINGS) ×1
APL PRP STRL LF DISP 70% ISPRP (MISCELLANEOUS) ×1
CHLORAPREP W/TINT 26 (MISCELLANEOUS) ×2 IMPLANT
CLIP LIGATING HEM O LOK PURPLE (MISCELLANEOUS) ×2 IMPLANT
COVER MAYO STAND XLG (MISCELLANEOUS) ×2 IMPLANT
COVER TIP SHEARS 8 DVNC (MISCELLANEOUS) ×2 IMPLANT
COVER TIP SHEARS 8MM DA VINCI (MISCELLANEOUS) ×1
DEFOGGER SCOPE WARMER CLEARIFY (MISCELLANEOUS) IMPLANT
DERMABOND ADVANCED .7 DNX12 (GAUZE/BANDAGES/DRESSINGS) ×2 IMPLANT
DRAPE ARM DVNC X/XI (DISPOSABLE) ×8 IMPLANT
DRAPE COLUMN DVNC XI (DISPOSABLE) ×2 IMPLANT
DRAPE DA VINCI XI ARM (DISPOSABLE) ×4
DRAPE DA VINCI XI COLUMN (DISPOSABLE) ×1
DRAPE HALF SHEET 40X57 (DRAPES) ×2 IMPLANT
ELECT REM PT RETURN 9FT ADLT (ELECTROSURGICAL) ×1
ELECTRODE REM PT RTRN 9FT ADLT (ELECTROSURGICAL) ×2 IMPLANT
GLOVE BIOGEL PI IND STRL 7.0 (GLOVE) ×4 IMPLANT
GLOVE SURG SS PI 7.5 STRL IVOR (GLOVE) ×4 IMPLANT
GOWN STRL REUS W/TWL LRG LVL3 (GOWN DISPOSABLE) ×6 IMPLANT
GRASPER SUT TROCAR 14GX15 (MISCELLANEOUS) IMPLANT
IRRIGATOR SUCT 8 DISP DVNC XI (IRRIGATION / IRRIGATOR) IMPLANT
IRRIGATOR SUCTION 8MM XI DISP (IRRIGATION / IRRIGATOR)
IV NS IRRIG 3000ML ARTHROMATIC (IV SOLUTION) IMPLANT
KIT TURNOVER KIT A (KITS) ×2 IMPLANT
MANIFOLD NEPTUNE II (INSTRUMENTS) ×2 IMPLANT
NDL HYPO 21X1.5 SAFETY (NEEDLE) ×2 IMPLANT
NDL INSUFFLATION 14GA 120MM (NEEDLE) ×2 IMPLANT
NEEDLE HYPO 21X1.5 SAFETY (NEEDLE) ×1 IMPLANT
NEEDLE INSUFFLATION 14GA 120MM (NEEDLE) ×1 IMPLANT
OBTURATOR OPTICAL STANDARD 8MM (TROCAR) ×1
OBTURATOR OPTICAL STND 8 DVNC (TROCAR) ×1
OBTURATOR OPTICALSTD 8 DVNC (TROCAR) ×2 IMPLANT
PACK LAP CHOLE LZT030E (CUSTOM PROCEDURE TRAY) ×2 IMPLANT
PAD ARMBOARD 7.5X6 YLW CONV (MISCELLANEOUS) ×2 IMPLANT
PENCIL HANDSWITCHING (ELECTRODE) ×2 IMPLANT
SEAL CANN UNIV 5-8 DVNC XI (MISCELLANEOUS) ×8 IMPLANT
SEAL XI 5MM-8MM UNIVERSAL (MISCELLANEOUS) ×4
SET TUBE SMOKE EVAC HIGH FLOW (TUBING) ×2 IMPLANT
SPIKE FLUID TRANSFER (MISCELLANEOUS) ×2 IMPLANT
SUT MNCRL AB 4-0 PS2 18 (SUTURE) ×4 IMPLANT
SUT VICRYL 0 AB UR-6 (SUTURE) ×2 IMPLANT
SYS RETRIEVAL 5MM INZII UNIV (BASKET) ×1
SYSTEM RETRIEVL 5MM INZII UNIV (BASKET) ×2 IMPLANT
WATER STERILE IRR 500ML POUR (IV SOLUTION) ×2 IMPLANT

## 2022-09-29 NOTE — Anesthesia Preprocedure Evaluation (Signed)
Anesthesia Evaluation  Patient identified by MRN, date of birth, ID band Patient awake    Reviewed: Allergy & Precautions, H&P , NPO status , Patient's Chart, lab work & pertinent test results  Airway Mallampati: II  TM Distance: >3 FB Neck ROM: Full    Dental  (+) Dental Advisory Given, Missing   Pulmonary Current Smoker and Patient abstained from smoking.   Pulmonary exam normal breath sounds clear to auscultation       Cardiovascular negative cardio ROS Normal cardiovascular exam Rhythm:Regular Rate:Normal     Neuro/Psych negative neurological ROS  negative psych ROS   GI/Hepatic ,GERD  Medicated and Controlled,,(+)     substance abuse  marijuana use  Endo/Other  negative endocrine ROS    Renal/GU Renal disease  negative genitourinary   Musculoskeletal negative musculoskeletal ROS (+)    Abdominal   Peds negative pediatric ROS (+)  Hematology negative hematology ROS (+)   Anesthesia Other Findings   Reproductive/Obstetrics negative OB ROS                             Anesthesia Physical Anesthesia Plan  ASA: 2  Anesthesia Plan: General   Post-op Pain Management:    Induction: Intravenous and Rapid sequence  PONV Risk Score and Plan: 3 and Ondansetron, Dexamethasone and Midazolam  Airway Management Planned: Oral ETT  Additional Equipment:   Intra-op Plan:   Post-operative Plan: Extubation in OR  Informed Consent: I have reviewed the patients History and Physical, chart, labs and discussed the procedure including the risks, benefits and alternatives for the proposed anesthesia with the patient or authorized representative who has indicated his/her understanding and acceptance.     Dental advisory given  Plan Discussed with: CRNA and Surgeon  Anesthesia Plan Comments:        Anesthesia Quick Evaluation

## 2022-09-29 NOTE — Op Note (Signed)
Patient:  Eddie Allen  DOB:  07/31/1986  MRN:  [id number]   Preop Diagnosis: Biliary colic, cholelithiasis  Postop Diagnosis: Same  Procedure: Robotic assisted laparoscopic cholecystectomy  Surgeon: Aviva Signs, MD  Anes: General endotracheal  Indications: Patient is a 37 year old white male who presents with biliary colic secondary to cholelithiasis.  The risks and benefits of the procedure including bleeding, infection, hepatobiliary injury, and the possibility of an open procedure were fully explained to the patient, who gave informed consent.  Procedure note: The patient was placed in the supine position.  After induction of general endotracheal anesthesia, the abdomen was prepped and draped using the usual sterile technique with ChloraPrep.  Surgical site confirmation was performed.  An infraumbilical incision was made down to the fascia.  A Veress needle was introduced into the abdominal cavity and confirmation of placement was done using the saline drop test.  The abdomen was then insufflated to 15 mmHg pressure.  An 8 mm trocar was introduced into the abdominal cavity under direct visualization without difficulty.  Additional 8 mm trocars were placed in the left upper quadrant, right lower quadrant, and right flank regions.  The robot was then targeted and docked.  The gallbladder was noted to have a large stone in the infundibulum.  The gallbladder was retracted in a dynamic fashion in order to provide a critical view of the triangle of [place].  The cystic duct was first identified.  Its junction to the infundibulum was fully identified.  The anatomy was confirmed with Firefly.  Hem-o-lok clips were placed proximally and distally on the cystic duct and the cystic duct was divided.  This was likewise done to the cystic artery.  There was also a small accessory branch and this was ligated using a Hem-o-lok.  The gallbladder was then freed away from the gallbladder fossa using Bovie  electrocautery.  The gallbladder was delivered through the left upper quadrant trocar site without difficulty.  No abnormal bleeding or bile leakage was noted.  The robot was undocked and all air was evacuated from the abdominal cavity prior to removal of the trocars.  All wounds were irrigated with normal saline.  All wounds were injected with Exparel.  The infraumbilical fascia as well as left upper quadrant fascia were reapproximated using an 0 Vicryl interrupted suture.  All skin incisions were closed using a 4-0 Monocryl subcuticular suture.  Dermabond was applied.  All tape and needle counts were correct at the end of the procedure.  The patient was extubated in the operating room and transferred to PACU in stable condition.  Complications: None  EBL: Minimal  Specimen: Gallbladder

## 2022-09-29 NOTE — Transfer of Care (Signed)
Immediate Anesthesia Transfer of Care Note  Patient: Eddie Allen  Procedure(s) Performed: XI ROBOTIC ASSISTED LAPAROSCOPIC CHOLECYSTECTOMY (Abdomen)  Patient Location: PACU  Anesthesia Type:General  Level of Consciousness: awake, alert , oriented, and patient cooperative  Airway & Oxygen Therapy: Patient Spontanous Breathing and Patient connected to face mask oxygen  Post-op Assessment: Report given to RN, Post -op Vital signs reviewed and stable, and Patient moving all extremities X 4  Post vital signs: Reviewed and stable  Last Vitals:  Vitals Value Taken Time  BP 126/77 09/29/22 1150  Temp    Pulse 90 09/29/22 1152  Resp 18 09/29/22 1152  SpO2 100 % 09/29/22 1152  Vitals shown include unvalidated device data.  Last Pain:  Vitals:   09/29/22 0958  TempSrc: Oral         Complications: No notable events documented.

## 2022-09-29 NOTE — Interval H&P Note (Signed)
History and Physical Interval Note:  09/29/2022 9:46 AM  Eddie Allen  has presented today for surgery, with the diagnosis of CHOLELITHIASIS.  The various methods of treatment have been discussed with the patient and family. After consideration of risks, benefits and other options for treatment, the patient has consented to  Procedure(s): XI ROBOTIC [place] (N/A) as a surgical intervention.  The patient's history has been reviewed, patient examined, no change in status, stable for surgery.  I have reviewed the patient's chart and labs.  Questions were answered to the patient's satisfaction.     Aviva Signs

## 2022-09-29 NOTE — Anesthesia Procedure Notes (Signed)
Procedure Name: Intubation Date/Time: 09/29/2022 10:39 AM  Performed by: Jonna Munro, CRNAPre-anesthesia Checklist: Patient identified, Emergency Drugs available, Suction available, Patient being monitored and Timeout performed Patient Re-evaluated:Patient Re-evaluated prior to induction Preoxygenation: Pre-oxygenation with 100% oxygen Induction Type: IV induction, Rapid sequence and Cricoid Pressure applied Laryngoscope Size: Mac and 4 Grade View: Grade I Tube type: Oral Tube size: 7.5 mm Number of attempts: 1 Airway Equipment and Method: Stylet Placement Confirmation: ETT inserted through vocal cords under direct vision, positive ETCO2, CO2 detector and breath sounds checked- equal and bilateral Secured at: 23 cm Tube secured with: Tape Dental Injury: Teeth and Oropharynx as per pre-operative assessment

## 2022-09-29 NOTE — Anesthesia Postprocedure Evaluation (Signed)
Anesthesia Post Note  Patient: Eddie Allen  Procedure(s) Performed: XI ROBOTIC ASSISTED LAPAROSCOPIC CHOLECYSTECTOMY (Abdomen)  Patient location during evaluation: PACU Anesthesia Type: General Level of consciousness: awake and alert and oriented Pain management: pain level controlled Vital Signs Assessment: post-procedure vital signs reviewed and stable Respiratory status: spontaneous breathing, nonlabored ventilation and respiratory function stable Cardiovascular status: blood pressure returned to baseline and stable Postop Assessment: no apparent nausea or vomiting Anesthetic complications: no  No notable events documented.   Last Vitals:  Vitals:   09/29/22 1235 09/29/22 1254  BP:  (!) 132/96  Pulse: 76 71  Resp: 11 18  Temp:  36.6 C  SpO2: 96% 97%    Last Pain:  Vitals:   09/29/22 1254  TempSrc: Oral  PainSc: 4                  Zyaire Dumas C James Lafalce

## 2022-09-30 LAB — SURGICAL PATHOLOGY

## 2022-10-04 ENCOUNTER — Ambulatory Visit: Payer: Medicaid Other | Admitting: General Surgery

## 2022-10-07 ENCOUNTER — Ambulatory Visit (INDEPENDENT_AMBULATORY_CARE_PROVIDER_SITE_OTHER): Payer: Medicaid Other | Admitting: General Surgery

## 2022-10-07 ENCOUNTER — Encounter: Payer: Self-pay | Admitting: General Surgery

## 2022-10-07 DIAGNOSIS — K8020 Calculus of gallbladder without cholecystitis without obstruction: Secondary | ICD-10-CM

## 2022-10-07 DIAGNOSIS — Z09 Encounter for follow-up examination after completed treatment for conditions other than malignant neoplasm: Secondary | ICD-10-CM

## 2022-10-07 NOTE — Progress Notes (Signed)
Postoperative telephone visit performed with patient.  Patient states he is having some soreness in the left upper quadrant incision site.  I told him this was the incision site that had to be extended in order to remove the gallbladder with a large stone.  He otherwise denies any nausea or vomiting.  He hopes to return back to work next week.  I told him to call me should any problems arise.  Overall, he is pleased with results.  As this was a part of the global surgical fee, this was not a billable visit.  Total telephone time was 2 minutes.
